# Patient Record
Sex: Male | Born: 1975 | Race: White | Hispanic: No | Marital: Married | Smoking: Never smoker
Health system: Southern US, Community
[De-identification: ages and names within clinical notes are randomized; demographics above are authoritative.]

## PROBLEM LIST (undated history)

## (undated) DIAGNOSIS — M109 Gout, unspecified: Secondary | ICD-10-CM

## (undated) HISTORY — DX: Gout, unspecified: M10.9

---

## 2000-11-04 ENCOUNTER — Ambulatory Visit (HOSPITAL_COMMUNITY): Admission: RE | Admit: 2000-11-04 | Discharge: 2000-11-04 | Payer: Self-pay | Admitting: Orthopedic Surgery

## 2006-02-20 ENCOUNTER — Ambulatory Visit: Payer: Self-pay | Admitting: Internal Medicine

## 2006-02-20 ENCOUNTER — Ambulatory Visit (HOSPITAL_COMMUNITY): Admission: RE | Admit: 2006-02-20 | Discharge: 2006-02-20 | Payer: Self-pay | Admitting: Orthopedic Surgery

## 2006-02-24 ENCOUNTER — Ambulatory Visit (HOSPITAL_COMMUNITY): Admission: RE | Admit: 2006-02-24 | Discharge: 2006-02-24 | Payer: Self-pay | Admitting: Orthopedic Surgery

## 2006-03-05 ENCOUNTER — Ambulatory Visit: Payer: Self-pay | Admitting: Internal Medicine

## 2006-04-02 ENCOUNTER — Ambulatory Visit: Payer: Self-pay | Admitting: Internal Medicine

## 2006-06-05 ENCOUNTER — Ambulatory Visit: Payer: Self-pay | Admitting: Internal Medicine

## 2006-09-03 ENCOUNTER — Ambulatory Visit: Payer: Self-pay | Admitting: Internal Medicine

## 2008-08-08 ENCOUNTER — Ambulatory Visit (HOSPITAL_COMMUNITY): Admission: RE | Admit: 2008-08-08 | Discharge: 2008-08-08 | Payer: Self-pay | Admitting: Orthopedic Surgery

## 2009-09-03 IMAGING — CR DG ORBITS FOR FOREIGN BODY
2 series · 2 of 2 positions shown · non-contrast
Comparison: 02/20/2006.

CLINICAL DATA: MRI screening examination.

ORBITS FOR FOREIGN BODY - 2 VIEW

[w waters (1 of 2)]
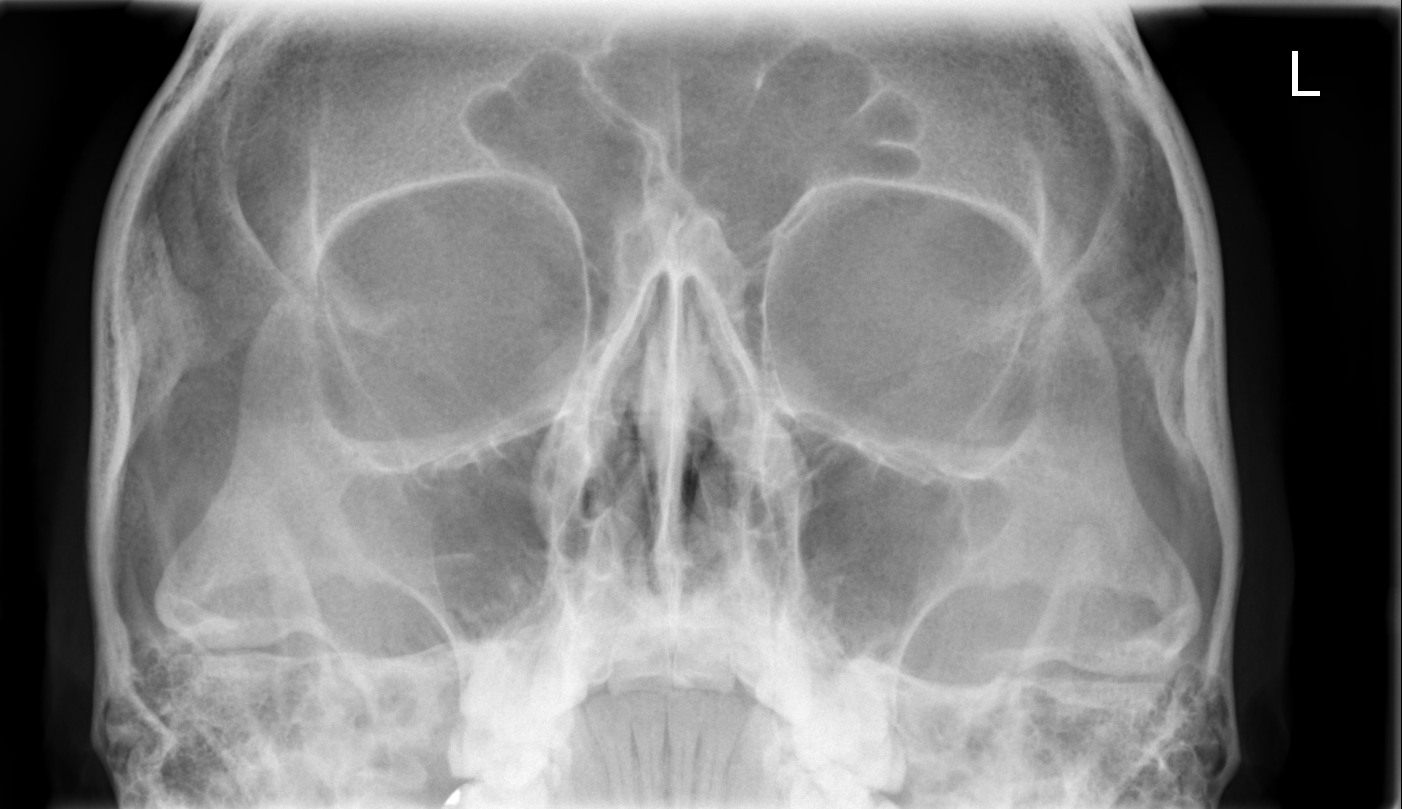

[w waters (2 of 2)]
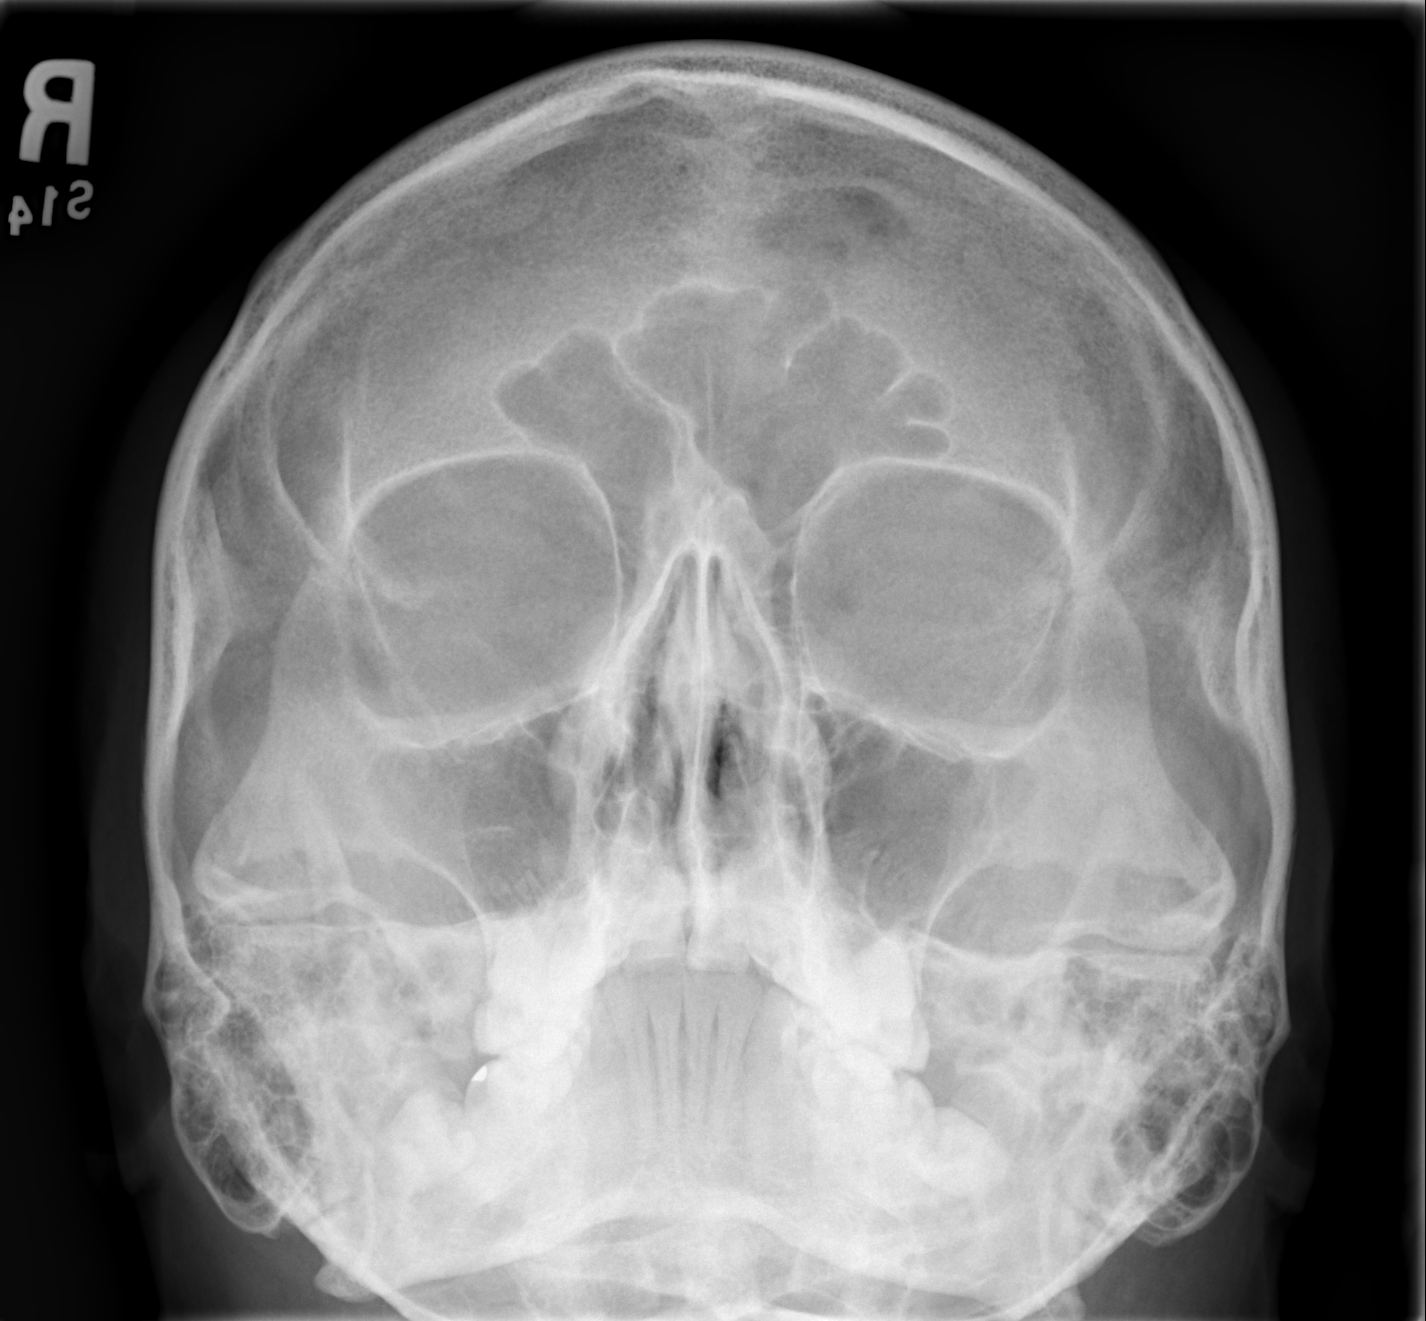

[2 of 2 positions shown; findings below may reference images not displayed]

FINDINGS: No radiopaque foreign body is identified.
IMPRESSION: The patient may proceed to MRI.

REF:Z9 DICTATED: 08/08/2008 [DATE]

## 2010-11-15 NOTE — Assessment & Plan Note (Signed)
[facility]                               PULMONARY OFFICE NOTE   NAME:Randall [name], Randall [name]                      MRN:          [id number]  DATE:04/02/2006                            DOB:          1975/09/22    HISTORY:  This is a 35 year old white male with a large right lower lobe  cavity felt to be most likely a lung abscess and returns for followup having  taken a prolonged course of Augmentin, feeling fine.  He denies any  recurrent cough, purulent sputum production, fevers, chills, sweats, or  overt reflux symptoms.   His medication consists only of:  1. Zegerid at 40 mg at bedtime.  2. Celebrex p.r.n.   PHYSICAL EXAMINATION:  He is a pleasant, ambulatory, white man in no acute  distress.  Afebrile, stable vital signs.  HEENT:  Oropharynx clear.  Lung fields completely clear bilaterally to auscultation and percussion with  no amphoric breath sounds.  HEART:  Regular rhythm without murmur, gallop, or rub.  ABDOMEN:  Soft, benign.  EXTREMITIES:  Warm without calf tenderness, cyanosis, clubbing, or edema.   Hemoglobin saturation is 99% on room air.   Chest x-ray shows further resolution of the cavitary mass in the right lung.   IMPRESSION:  Resolving lung abscess with the residual scarring that is  likely to take months to resolve radiographically.  I have recommended that  he repeat a chest x-ray at 6 weeks and then again 3 months after that to  follow the area that heals, especially since its history is somewhat unusual  for a typical lung abscess.            ______________________________  Charlaine Dalton Sherene Sires, MD, [facility]      MBW/MedQ  DD:  04/02/2006  DT:  04/03/2006  Job #:  [id number]   cc:   Fara Chute

## 2010-11-15 NOTE — Assessment & Plan Note (Signed)
[facility]                               PULMONARY OFFICE NOTE   NAME:Randall [name], Randall Randall [name] EMPSON                      MRN:          [id number]  DATE:02/21/2006                            DOB:          12-Jan-1976    REFERRING PHYSICIAN:  Fara Chute, M.D.   CHIEF COMPLAINT:  Cough.   HISTORY:  This is a 35 year old white male, never smoker who states he was  in perfectly good health until he began seeing cleaning up his ex-wife's  house.  He apparently was vacuuming up some spots of dog hair and spraying  the outside of the building with a bleach-containing chemical to control  mildew.  Within 12 hours of his last exposure, he began having a hacking  cough associated with a sore throat and sputum that was just only mucoid in  nature then but then developed hemoptysis and received a chest x-ray  suggesting pneumonia which was treated with antibiotics in the form of  Levaquin and within two days was quite a bit better.  All he has been left  with now is a minimal dry cough that bothers him, more in the day than the  night.  He has only minimal bleeding.  He has traces of bright red blood  every few days but no fever or pleuritic pain, significant dyspnea,  orthopnea, PND, myalgias, arthralgias, unintended weight loss, fevers,  chills, or sweats.   PAST MEDICAL HISTORY:  No significant medical problems or operations.   ALLERGIES:  None known.   MEDICATIONS:  He takes none.   SOCIAL HISTORY:  He drinks once or twice yearly. Denies drinking to the  point of intoxication or unusual travel, pet or hobby exposure.   FAMILY HISTORY:  Significant for the absence of respiratory diseases.   REVIEW OF SYSTEMS:  Taken from the ER worksheets.  Significant problems  outlined above.   PHYSICAL EXAMINATION:  GENERAL:  This is a pleasant, ambulatory white male  in no acute distress.  VITAL SIGNS:  Stable.  HEENT:  Normal dentition.  Oropharynx is clear.  NECK:   Supple without cervical adenopathy or tenderness.  Trachea is  midline.  No thyromegaly.  CHEST:  Lung fields perfectly clear to auscultation and percussion with no  amphoric or other normal breath sounds.  HEART:  Regular rate and rhythm without murmur, gallop or rub.  ABDOMEN:  Soft, benign.  EXTREMITIES:  Without calf tenderness, cyanosis, clubbing or edema.   LABORATORY DATA:  Hemoglobin saturation 98% on room air.  CT scan from  August 16 suggests a cavitary mass in the superior segment of the right  lower lobe.  I repeated the chest x-ray today and see no change since the  August 16.   IMPRESSION:  Cavitary mass probably represents the residual from a  necrotizing pneumonia in this patient with minimal residual cough.  The  thickness of the lesion is a bit unusual as is the absence of an air fluid  level or history of putrid sputum, however.  I could not elicit any history  to suggest that he is  at risk for aspiration syndromes.  Also I could not  elicit any history suggestive that he was at risk for aspiration syndromes.   I, therefore, recommend the following approach:  1. Empiric Augmentin 875 b.i.d. for full 10 days and then return here at      the end of two weeks for chest x-ray.  2. Baseline CBC with differential and sed rate today.  3. Treat the cough which consists of a tickle in his throat when he lies      down with Zegerid empirically at bedtime.  4. Diet directed at GERD to see if this will makes any difference in terms      of his cough, his only respiratory complaint present.                                   Charlaine Dalton. Sherene Sires, MD, [facility]   MBW/MedQ  DD:  02/21/2006  DT:  02/21/2006  Job #:  [id number]

## 2010-11-15 NOTE — Assessment & Plan Note (Signed)
[facility]                             PULMONARY OFFICE NOTE   NAME:Randall [name], Randall [name]                      MRN:          [id number]  DATE:06/05/2006                            DOB:          01-Sep-1975    HISTORY:  A 35 year old white male in for followup evaluation of a right  lung abscess that developed in August of 2007 with no obvious risk  factors.  He denies any history of aspiration, recent dental work, or  dental problems, or alcohol exposure.   Presently, he comes in for followup x-ray because of persistent cystic  opacity in the right lower lobe and denies any symptoms at all of any  chest pain, fever, chills, sweats, or unintended weight loss.   PHYSICAL EXAMINATION:  He is a pleasant, ambulatory white male in no  acute distress.  VITAL SIGNS:  Stable vital signs.  His weight is up another 5 pounds to  231 now.  HEENT:  Unremarkable.  Oropharynx is clear.  Dentition intact.  I do not  see any obvious dental lesions.  NECK:  Supple without cervical adenopathy. Trachea is midline.  No  thyromegaly.  LUNGS:  Fields perfectly clear bilaterally to auscultation and  percussion.  CARDIAC:  Regular rate and rhythm without murmur, gallop, or rub.  ABDOMEN:  Soft and benign.  EXTREMITIES:  Warm without calf tenderness, cyanosis, clubbing, or  edema.   Heme saturation 99% on room air.   Chest x-ray shows almost complete resolution of all infiltrates in the  right lower lobe.   IMPRESSION:  Classic lung abscess radiographically, but not clinically.  The patient informs me today that, not only has he not had dental  procedures, he has never had his teeth checked, although I do not see  any obvious lesions, the most likely source for this problem is in fact  dental.   I therefore recommended that he have a dental evaluation within the next  6 months and will not recommend any further pulmonary followup.     Charlaine Dalton. Sherene Sires, MD, [facility]  Electronically Signed    MBW/MedQ  DD: 06/07/2006  DT: 06/07/2006  Job #: [id number]   cc:   Fara Chute

## 2010-11-15 NOTE — Assessment & Plan Note (Signed)
[facility]                               PULMONARY OFFICE NOTE   NAME:Randall [name], Randall [name]                      MRN:          [id number]  DATE:03/05/2006                            DOB:          08/06/1975    HISTORY:  This 35 year old white male  with complete resolution of cough  after a 10-day course of Augmentin and empiric treatment with Zegerid.  He  returns as requested for followup of a possible lung abscess in the right  lower lobe but had no classic history to support lung abscess.   PHYSICAL EXAMINATION:  GENERAL:  He is a pleasant, ambulatory, white man in  no acute distress.  VITAL SIGNS:  Stable.  HEENT:  Unremarkable.  Oropharynx is clear. Dentition is intact.  In fact,  his dental hygiene appears quite good.  NECK:  Supple without cervical adenopathy, tenderness.  CHEST:  Lung fields perfectly clear bilaterally to auscultation and  percussion.  HEART:  Regular rhythm without murmur, gallop or rub.  ABDOMEN:  Obese but otherwise soft, benign.  EXTREMITIES:  Warm without calf tenderness, cyanosis or clubbing or edema.   Chest x-ray shows significant reduction in both diameter and density of the  right lung cavity.   Sed rate was normal.  CBC was normal on February 20, 2006.   IMPRESSION:  His solitary pulmonary cavity that is improving is consistent  with lung abscess although he does not have any of the typical features of a  lung abscess.  Since he presented with a sore throat, one possibility is  that he had a form of Lemierre's disease which is associated with  tonsillitis and inferior vena caval suppurative phlebitis.  However, if this  were the case, he had a very benign clinical course.   In any case, this did not appear to be consistent with a bronchogenic  carcinoma or fungal infection and I think we can follow him conservatively  with followup here in four weeks.                                   Charlaine Dalton. Sherene Sires, MD,  [facility]   MBW/MedQ  DD:  03/05/2006  DT:  03/06/2006  Job #:  [id number]

## 2010-11-15 NOTE — Assessment & Plan Note (Signed)
[facility]                             PULMONARY OFFICE NOTE   NAME:Randall [name], Randall [name]                      MRN:          [id number]  DATE:09/03/2006                            DOB:          September 24, 1975    FINAL FOLLOWUP OFFICE VISIT   HISTORY:  This patient was originally seen February 20, 2006, with a very  impressive cavitary mass in the right lower lobe that was consistent  with a lung abscess.  The patient had never been a smoker, was treated  with a prolonged course of antibiotics and had complete clinical  improvement but had residual cystic opacity in the right lower lobe.  I  suspected the problem was related to dental abnormalities but the  patient has yet to see a dentist.  He has no dental symptoms and denies  any history of excessive alcohol use.  Since previous visit he did have  one bad cold but now it has resolved with no residual cough, dyspnea,  fevers, chills, sweats, pleuritic pain, or unintended weight loss.   PHYSICAL EXAMINATION:  GENERAL:  He is a pleasant, ambulatory,  moderately-obese white man in no acute distress.  Weight 231 pounds, no  change from baseline.  HEENT:  Unremarkable, dentition appears intact.  NECK:  Supple without cervical adenopathy or tenderness.  Trachea is  midline, no thyromegaly.  LUNG FIELDS:  Perfectly clear bilateral to auscultation and percussion  with no adventitial breath sounds.  HEART:  There is a regular rate and rhythm without murmur, gallop or  rub.  ABDOMEN:  Soft, benign.  EXTREMITIES:  Warm without calf tenderness, cyanosis, clubbing, or  edema.   Chest x-ray continues to show very minimum cystic changes in the right  perihilar area on the PA view which appear to project posteriorly but  cannot be seen well on the lateral view.   IMPRESSION:  Minimal residual scarring status post a very impressive  right lower lobe abscess that most likely was on the basis of aspiration  in this never  smoker.   I did recommend that he see a dentist but do not recommend any further  regular pulmonary followup here.  We will see him on a p.r.n. basis.     Charlaine Dalton. Sherene Sires, MD, [facility]  Electronically Signed    MBW/MedQ  DD: 09/03/2006  DT: 09/03/2006  Job #: [id number]   cc:   [facility] in [place]

## 2017-07-16 DIAGNOSIS — K080 Exfoliation of teeth due to systemic causes: Secondary | ICD-10-CM | POA: Diagnosis not present

## 2017-09-01 DIAGNOSIS — H18603 Keratoconus, unspecified, bilateral: Secondary | ICD-10-CM | POA: Diagnosis not present

## 2017-11-16 DIAGNOSIS — Z79899 Other long term (current) drug therapy: Secondary | ICD-10-CM | POA: Diagnosis not present

## 2017-11-16 DIAGNOSIS — S61212A Laceration without foreign body of right middle finger without damage to nail, initial encounter: Secondary | ICD-10-CM | POA: Diagnosis not present

## 2017-11-16 DIAGNOSIS — S61411A Laceration without foreign body of right hand, initial encounter: Secondary | ICD-10-CM | POA: Diagnosis not present

## 2017-11-16 DIAGNOSIS — W458XXA Other foreign body or object entering through skin, initial encounter: Secondary | ICD-10-CM | POA: Diagnosis not present

## 2017-11-16 DIAGNOSIS — Z8639 Personal history of other endocrine, nutritional and metabolic disease: Secondary | ICD-10-CM | POA: Diagnosis not present

## 2018-01-13 DIAGNOSIS — Z6836 Body mass index (BMI) 36.0-36.9, adult: Secondary | ICD-10-CM | POA: Diagnosis not present

## 2018-01-13 DIAGNOSIS — M25561 Pain in right knee: Secondary | ICD-10-CM | POA: Diagnosis not present

## 2018-01-19 DIAGNOSIS — K080 Exfoliation of teeth due to systemic causes: Secondary | ICD-10-CM | POA: Diagnosis not present

## 2018-07-27 DIAGNOSIS — K080 Exfoliation of teeth due to systemic causes: Secondary | ICD-10-CM | POA: Diagnosis not present

## 2019-05-24 ENCOUNTER — Other Ambulatory Visit: Payer: Self-pay

## 2019-05-24 DIAGNOSIS — Z20822 Contact with and (suspected) exposure to covid-19: Secondary | ICD-10-CM

## 2019-05-25 LAB — NOVEL CORONAVIRUS, NAA: SARS-CoV-2, NAA: NOT DETECTED

## 2019-07-29 DIAGNOSIS — Z20828 Contact with and (suspected) exposure to other viral communicable diseases: Secondary | ICD-10-CM | POA: Diagnosis not present

## 2022-05-06 ENCOUNTER — Encounter: Payer: Self-pay | Admitting: Emergency Medicine

## 2022-05-06 ENCOUNTER — Ambulatory Visit
Admission: EM | Admit: 2022-05-06 | Discharge: 2022-05-06 | Disposition: A | Discharge status: Home / Self Care | Payer: Federal, State, Local not specified - PPO | Attending: Nurse Practitioner | Admitting: Nurse Practitioner

## 2022-05-06 DIAGNOSIS — J329 Chronic sinusitis, unspecified: Secondary | ICD-10-CM

## 2022-05-06 DIAGNOSIS — B9689 Other specified bacterial agents as the cause of diseases classified elsewhere: Secondary | ICD-10-CM | POA: Diagnosis not present

## 2022-05-06 MED ORDER — AMOXICILLIN-POT CLAVULANATE 875-125 MG PO TABS: 1.0000 | ORAL_TABLET | Freq: Two times a day (BID) | ORAL | 0 refills | Status: AC

## 2022-05-06 NOTE — Discharge Instructions (Addendum)
Some things that can make you feel better are: - Increased rest - Increasing fluid with water/sugar free electrolytes - Acetaminophen and ibuprofen as needed for fever/pain - Salt water gargling, chloraseptic spray and throat lozenges - OTC guaifenesin (Mucinex) 600 mg twice daily - Saline sinus flushes or a neti pot - Humidifying the air

## 2022-05-06 NOTE — ED Provider Notes (Signed)
[facility]    CSN: [id number] Arrival date & time: 05/06/22  1339      History   Chief Complaint No chief complaint on file.   HPI Randall [name] is a 46 y.o. male.   Patient presents for symptoms for the past few weeks.  Reports he had flulike symptoms a few weeks ago and initially, symptoms started to improve.  Over the last 4 days, he has developed significant nasal congestion, runny nose, sinus pressure, sore throat in the morning when he wakes up, pressure around his eyes, pain in his gums, bilateral ear pain, and occasional cough from drainage.  He denies fevers, body aches, chills, shortness of breath or chest pain, chest congestion, sneezing, headache, abdominal pain, nausea/vomiting, diarrhea, decreased appetite, and fatigue.  No known sick contacts.  Has been taking NyQuil for symptoms at nighttime with minimal benefit.  Blood pressure today is elevated-reports this is high for him.  He denies chest pain, shortness of breath, vision changes, dizziness/lightheadedness, lower extremity swelling today.  He does have a primary care provider.    Past Medical History:  Diagnosis Date   Gout     There are no problems to display for this patient.   History reviewed. No pertinent surgical history.     Home Medications    Prior to Admission medications   Medication Sig Start Date End Date Taking? Authorizing Provider  amoxicillin-clavulanate (AUGMENTIN) 875-125 MG tablet Take 1 tablet by mouth 2 (two) times daily for 7 days. 05/06/22 05/13/22 Yes Valentino Nose, NP    Family History History reviewed. No pertinent family history.  Social History Social History   Tobacco Use   Smoking status: Never   Smokeless tobacco: Never  Vaping Use   Vaping Use: Never used  Substance Use Topics   Alcohol use: Never   Drug use: Never     Allergies   Patient has no known allergies.   Review of Systems Review of Systems Per HPI  Physical  Exam Triage Vital Signs ED Triage Vitals  Enc Vitals Group     BP 05/06/22 1345 (!) 155/93     Pulse Rate 05/06/22 1345 100     Resp 05/06/22 1345 18     Temp 05/06/22 1345 98.4 F (36.9 C)     Temp Source 05/06/22 1345 Oral     SpO2 05/06/22 1345 96 %     Weight --      Height --      Head Circumference --      Peak Flow --      Pain Score 05/06/22 1347 3     Pain Loc --      Pain Edu? --      Excl. in GC? --    No data found.  Updated Vital Signs BP (!) 155/93 (BP Location: Right Arm)   Pulse 100   Temp 98.4 F (36.9 C) (Oral)   Resp 18   SpO2 96%   Visual Acuity Right Eye Distance:   Left Eye Distance:   Bilateral Distance:    Right Eye Near:   Left Eye Near:    Bilateral Near:     Physical Exam Vitals and nursing note reviewed.  Constitutional:      General: He is not in acute distress.    Appearance: Normal appearance. He is not ill-appearing or toxic-appearing.  HENT:     Head: Normocephalic and atraumatic.     Right Ear: Tympanic membrane, ear canal  and external ear normal.     Left Ear: Tympanic membrane, ear canal and external ear normal.     Nose: Congestion present. No rhinorrhea.     Right Sinus: Maxillary sinus tenderness present. No frontal sinus tenderness.     Left Sinus: Maxillary sinus tenderness and frontal sinus tenderness present.     Mouth/Throat:     Mouth: Mucous membranes are moist.     Pharynx: Oropharynx is clear. Posterior oropharyngeal erythema present. No oropharyngeal exudate.     Tonsils: No tonsillar exudate. 0 on the right. 0 on the left.  Eyes:     General: No scleral icterus.    Extraocular Movements: Extraocular movements intact.  Cardiovascular:     Rate and Rhythm: Normal rate and regular rhythm.  Pulmonary:     Effort: Pulmonary effort is normal. No respiratory distress.     Breath sounds: Normal breath sounds. No wheezing, rhonchi or rales.  Abdominal:     General: Abdomen is flat. Bowel sounds are normal. There  is no distension.     Palpations: Abdomen is soft.     Tenderness: There is no abdominal tenderness. There is no guarding.  Musculoskeletal:     Cervical back: Normal range of motion and neck supple.  Lymphadenopathy:     Cervical: No cervical adenopathy.  Skin:    General: Skin is warm and dry.     Coloration: Skin is not jaundiced or pale.     Findings: No erythema or rash.  Neurological:     Mental Status: He is alert and oriented to person, place, and time.  Psychiatric:        Behavior: Behavior is cooperative.      UC Treatments / Results  Labs (all labs ordered are listed, but only abnormal results are displayed) Labs Reviewed - No data to display  EKG   Radiology No results found.  Procedures Procedures (including critical care time)  Medications Ordered in UC Medications - No data to display  Initial Impression / Assessment and Plan / UC Course  I have reviewed the triage vital signs and the nursing notes.  Pertinent labs & imaging results that were available during my care of the patient were reviewed by me and considered in my medical decision making (see chart for details).   Patient is well-appearing, afebrile, not tachycardic, not tachypneic, oxygenating well on room air.  He is mildly hypertensive today with multiple rechecks.  He has no symptoms of hypertension today.  Recommended monitoring at home and close follow-up with primary care provider if it remains elevated above 130/80.  Bacterial sinusitis Treat with Augmentin twice daily for 7 days Supportive care discussed  ER and return precautions discussed  The patient was given the opportunity to ask questions.  All questions answered to their satisfaction.  The patient is in agreement to this plan.    Final Clinical Impressions(s) / UC Diagnoses   Final diagnoses:  Bacterial sinusitis     Discharge Instructions      Some things that can make you feel better are: - Increased rest -  Increasing fluid with water/sugar free electrolytes - Acetaminophen and ibuprofen as needed for fever/pain - Salt water gargling, chloraseptic spray and throat lozenges - OTC guaifenesin (Mucinex) 600 mg twice daily - Saline sinus flushes or a neti pot - Humidifying the air     ED Prescriptions     Medication Sig Dispense Auth. Provider   amoxicillin-clavulanate (AUGMENTIN) 875-125 MG tablet Take 1 tablet  by mouth 2 (two) times daily for 7 days. 14 tablet Eulogio Bear, NP      PDMP not reviewed this encounter.   Eulogio Bear, NP 05/06/22 1534

## 2022-05-06 NOTE — ED Triage Notes (Signed)
Thinks he has the flu several weeks ago.  Continues to have sinus congestion and facial pain.  Pain to upper teeth.

## 2023-07-28 ENCOUNTER — Ambulatory Visit
Admission: EM | Admit: 2023-07-28 | Discharge: 2023-07-28 | Disposition: A | Discharge status: Home / Self Care | Payer: Federal, State, Local not specified - PPO | Attending: Nurse Practitioner | Admitting: Nurse Practitioner

## 2023-07-28 DIAGNOSIS — J069 Acute upper respiratory infection, unspecified: Secondary | ICD-10-CM

## 2023-07-28 MED ORDER — CETIRIZINE HCL 10 MG PO TABS: 10.0000 mg | ORAL_TABLET | Freq: Every day | ORAL | 0 refills | Status: AC

## 2023-07-28 MED ORDER — PSEUDOEPH-BROMPHEN-DM 30-2-10 MG/5ML PO SYRP: 5.0000 mL | ORAL_SOLUTION | Freq: Four times a day (QID) | ORAL | 0 refills | Status: AC | PRN

## 2023-07-28 MED ORDER — FLUTICASONE PROPIONATE 50 MCG/ACT NA SUSP: 2.0000 | Freq: Every day | NASAL | 0 refills | Status: AC

## 2023-07-28 NOTE — ED Provider Notes (Signed)
[facility]    CSN: [id number] Arrival date & time: 07/28/23  0809      History   Chief Complaint No chief complaint on file.   HPI Randall [name] is a 48 y.o. male.   The history is provided by the patient.   Patient presents for complaints of nasal congestion, sinus pressure, and postnasal drainage have been present over the past several days.  Patient states he is improving from an upper respiratory infection, but continues to have these symptoms.  Denies fever, chills, headache, ear pain, wheezing, difficulty breathing, chest pain, abdominal pain, nausea, vomiting, diarrhea, or rash.  Patient reports he has been taking over-the-counter DayQuil for his symptoms with minimal relief.  Past Medical History:  Diagnosis Date   Gout     There are no active problems to display for this patient.   History reviewed. No pertinent surgical history.     Home Medications    Prior to Admission medications   Not on File    Family History History reviewed. No pertinent family history.  Social History Social History   Tobacco Use   Smoking status: Never   Smokeless tobacco: Never  Vaping Use   Vaping status: Never Used  Substance Use Topics   Alcohol use: Never   Drug use: Never     Allergies   Patient has no known allergies.   Review of Systems Review of Systems Per HPI  Physical Exam Triage Vital Signs ED Triage Vitals [07/28/23 0818]  Encounter Vitals Group     BP (!) 141/82     Systolic BP Percentile      Diastolic BP Percentile      Pulse Rate 76     Resp 17     Temp 98.3 F (36.8 C)     Temp Source Oral     SpO2 96 %     Weight      Height      Head Circumference      Peak Flow      Pain Score 0     Pain Loc      Pain Education      Exclude from Growth Chart    No data found.  Updated Vital Signs BP (!) 141/82 (BP Location: Right Arm)   Pulse 76   Temp 98.3 F (36.8 C) (Oral)   Resp 17   SpO2 96%   Visual  Acuity Right Eye Distance:   Left Eye Distance:   Bilateral Distance:    Right Eye Near:   Left Eye Near:    Bilateral Near:     Physical Exam Vitals and nursing note reviewed.  Constitutional:      General: He is not in acute distress.    Appearance: Normal appearance.  HENT:     Head: Normocephalic.     Right Ear: Tympanic membrane, ear canal and external ear normal.     Left Ear: Tympanic membrane, ear canal and external ear normal.     Nose: Congestion present.     Right Turbinates: Enlarged and swollen.     Left Turbinates: Enlarged and swollen.     Right Sinus: Maxillary sinus tenderness present. No frontal sinus tenderness.     Left Sinus: Maxillary sinus tenderness present. No frontal sinus tenderness.     Mouth/Throat:     Lips: Pink.     Mouth: Mucous membranes are moist.     Pharynx: Uvula midline. Postnasal drip present. No pharyngeal swelling,  oropharyngeal exudate, posterior oropharyngeal erythema or uvula swelling.     Comments: Cobblestoning present to posterior oropharynx  Eyes:     Extraocular Movements: Extraocular movements intact.     Conjunctiva/sclera: Conjunctivae normal.     Pupils: Pupils are equal, round, and reactive to light.  Cardiovascular:     Rate and Rhythm: Normal rate and regular rhythm.     Pulses: Normal pulses.     Heart sounds: Normal heart sounds.  Pulmonary:     Effort: Pulmonary effort is normal. No respiratory distress.     Breath sounds: Normal breath sounds. No stridor. No wheezing, rhonchi or rales.  Abdominal:     General: Bowel sounds are normal.     Palpations: Abdomen is soft.  Musculoskeletal:     Cervical back: Normal range of motion.  Lymphadenopathy:     Cervical: No cervical adenopathy.  Skin:    General: Skin is warm and dry.  Neurological:     General: No focal deficit present.     Mental Status: He is alert and oriented to person, place, and time.  Psychiatric:        Mood and Affect: Mood normal.         Behavior: Behavior normal.      UC Treatments / Results  Labs (all labs ordered are listed, but only abnormal results are displayed) Labs Reviewed - No data to display  EKG   Radiology No results found.  Procedures Procedures (including critical care time)  Medications Ordered in UC Medications - No data to display  Initial Impression / Assessment and Plan / UC Course  I have reviewed the triage vital signs and the nursing notes.  Pertinent labs & imaging results that were available during my care of the patient were reviewed by me and considered in my medical decision making (see chart for details).  On exam, lung sounds are clear throughout, room air sats at 96%.  Suspect patient is on the resolving end of an upper respiratory infection.  Will provide symptomatic treatment with Bromfed-DM for the cough, fluticasone 50 micro nasal spray for nasal congestion, sinus pressure, and bilateral ear pressure, and cetirizine 10 mg as an antihistamine.  Supportive care recommendations were provided and discussed with the patient to include fluids, rest, over-the-counter analgesics, normal saline nasal spray, use of a humidifier at nighttime during sleep.  Discussed indications with the patient regarding follow-up.  Patient was in agreement with this plan of care and verbalizes understanding.  All questions were answered.  Patient stable for discharge.   Final Clinical Impressions(s) / UC Diagnoses   Final diagnoses:  None   Discharge Instructions   None    ED Prescriptions   None    PDMP not reviewed this encounter.   Abran Cantor, NP 07/28/23 [phone]

## 2023-07-28 NOTE — Discharge Instructions (Signed)
Take medication as directed. Increase fluids and get plenty of rest. May take over-the-counter Ibuprofen or Tylenol as needed for pain, fever, or general discomfort. Recommend normal saline nasal spray to help with nasal congestion throughout the day. For your cough, it may be helpful to use a humidifier and sleeping elevated during bedtime.   As discussed, if symptoms or not improving over the next 5 to 7 days, or appear to be worsening, you may follow-up in this clinic or with your primary care physician for further evaluation. Follow-up as needed.

## 2023-07-28 NOTE — ED Triage Notes (Signed)
Pt reports being sick last week and having left over sx's he thinks may be a sinus infection.
